# Patient Record
Sex: Male | Born: 1965 | Hispanic: No | Marital: Single | Smoking: Current every day smoker
Health system: Southern US, Community
[De-identification: ages and names within clinical notes are randomized; demographics above are authoritative.]

---

## 2013-08-05 ENCOUNTER — Ambulatory Visit: Payer: Self-pay | Admitting: Physician Assistant

## 2013-08-05 VITALS — BP 134/82 | HR 72 | Temp 97.9°F | Resp 16 | Ht 67.0 in | Wt 188.0 lb

## 2013-08-05 DIAGNOSIS — Z0289 Encounter for other administrative examinations: Secondary | ICD-10-CM

## 2013-08-05 NOTE — Progress Notes (Signed)
U/A  Sp. Gr.1.030 Protein- Neg Blood- Trace Glucose- Neg

## 2013-08-05 NOTE — Progress Notes (Signed)
Patient ID: Henry Villanueva MRN: [id number], DOB: [birth year] 47 y.o. Date of Encounter: 08/05/2013, 11:12 AM  Primary Physician: No primary provider on file.  Chief Complaint: DOT Physical   HPI: 47 y.o. male with history noted below here for DOT physical. Doing well. No issues/complaints. Generally healthy. No major health issues. No surgeries. No CP, chest tightness, SOB, wheezing, or palpitations. Working on quitting smoking. Usually gets a 2 year card.    Review of Systems: Consitutional: No fever, chills, fatigue, night sweats, lymphadenopathy, or weight changes. Eyes: No visual changes, eye redness, or discharge. ENT/Mouth: Ears: No otalgia, tinnitus, hearing loss, discharge. Nose: No congestion, rhinorrhea, sinus pain, or epistaxis. Throat: No sore throat, post nasal drip, or teeth pain. Cardiovascular: No CP, palpitations, diaphoresis, DOE, edema, orthopnea, PND. Respiratory: No cough, hemoptysis, SOB, or wheezing. Gastrointestinal: No anorexia, dysphagia, reflux, pain, nausea, vomiting, hematemesis, diarrhea, constipation, BRBPR, or melena. Genitourinary: No dysuria, frequency, urgency, hematuria, incontinence, nocturia, decreased urinary stream, discharge, impotence, or testicular pain/masses. Musculoskeletal: No decreased ROM, myalgias, stiffness, joint swelling, or weakness. Skin: No rash, erythema, lesion changes, pain, warmth, jaundice, or pruritis. Neurological: No headache, dizziness, syncope, seizures, tremors, memory loss, coordination problems, or paresthesias. Psychological: No anxiety, depression, hallucinations, SI/HI. Endocrine: No fatigue, polydipsia, polyphagia, polyuria, or known diabetes.   History reviewed. No pertinent past medical history.   History reviewed. No pertinent past surgical history.  Home Meds:  Prior to Admission medications   Not on File    Allergies: Not on File  History   Social History  . Marital Status: Single    Spouse Name: N/A   Number of Children: N/A  . Years of Education: N/A   Occupational History  . Not on file.   Social History Main Topics  . Smoking status: Heavy Tobacco Smoker -- 0.50 packs/day    Types: Cigarettes  . Smokeless tobacco: Not on file  . Alcohol Use: No  . Drug Use: No  . Sexual Activity: Not on file   Other Topics Concern  . Not on file   Social History Narrative  . No narrative on file    Family History  Problem Relation Age of Onset  . Cancer Father     bladder    Physical Exam: Blood pressure 142/84, pulse 72, temperature 97.9 F (36.6 C), temperature source Oral, resp. rate 16, height 5\' 7"  (1.702 m), weight 188 lb (85.276 kg), SpO2 98.00%. BP recheck 134/82.  General: Well developed, well nourished, in no acute distress. HEENT: Normocephalic, atraumatic. Conjunctiva pink, sclera non-icteric. Pupils 2 mm constricting to 1 mm, round, regular, and equally reactive to light and accomodation. EOMI. Internal auditory canal clear. TMs with good cone of light and without pathology. Nasal mucosa pink. Nares are without discharge. No sinus tenderness. Oral mucosa pink. Dentition normal. Pharynx without exudate.   Neck: Supple. Trachea midline. No thyromegaly. Full ROM. No lymphadenopathy. Lungs: Clear to auscultation bilaterally without wheezes, rales, or rhonchi. Breathing is of normal effort and unlabored. Cardiovascular: RRR with S1 S2. No murmurs, rubs, or gallops appreciated. Distal pulses 2+ symmetrically. No carotid or abdominal bruits. Abdomen: Soft, non-tender, non-distended with normoactive bowel sounds. No hepatosplenomegaly or masses. No rebound/guarding. No CVA tenderness. Without hernias.  Genitourinary: Circumcised male. No penile lesions. Testes descended bilaterally, and smooth without tenderness or masses.  Musculoskeletal: Full range of motion and 5/5 strength throughout. Without swelling, atrophy, tenderness, crepitus, or warmth. Extremities without clubbing,  cyanosis, or edema. Calves supple. Skin: Warm and moist without erythema, ecchymosis,  wounds, or rash. Neuro: A+Ox3. CN II-XII grossly intact. Moves all extremities spontaneously. Full sensation throughout. Normal gait. DTR 2+ throughout upper and lower extremities. Finger to nose intact. Psych:  Responds to questions appropriately with a normal affect.    Assessment/Plan:  47 y.o. male here for DOT physical. -Cleared -2 year card issued -Form completed -RTC prn  Signed, Eula Listen, PA-C 08/05/2013 11:12 AM

## 2015-05-06 ENCOUNTER — Other Ambulatory Visit: Payer: Self-pay | Admitting: Specialist

## 2015-05-06 DIAGNOSIS — M5030 Other cervical disc degeneration, unspecified cervical region: Secondary | ICD-10-CM

## 2015-05-13 ENCOUNTER — Other Ambulatory Visit: Payer: Self-pay

## 2015-05-13 ENCOUNTER — Other Ambulatory Visit: Payer: Self-pay | Admitting: Specialist

## 2015-05-13 ENCOUNTER — Ambulatory Visit
Admission: RE | Admit: 2015-05-13 | Discharge: 2015-05-13 | Disposition: A | Discharge status: Home / Self Care | Payer: 59 | Source: Ambulatory Visit | Attending: Specialist | Admitting: Specialist

## 2015-05-13 DIAGNOSIS — M5030 Other cervical disc degeneration, unspecified cervical region: Secondary | ICD-10-CM

## 2015-07-21 ENCOUNTER — Ambulatory Visit: Payer: 59 | Admitting: Physical Therapy

## 2015-08-02 ENCOUNTER — Ambulatory Visit: Payer: 59 | Attending: Neurological Surgery | Admitting: Physical Therapy

## 2015-08-02 ENCOUNTER — Encounter: Payer: Self-pay | Admitting: Physical Therapy

## 2015-08-02 DIAGNOSIS — M79602 Pain in left arm: Secondary | ICD-10-CM | POA: Diagnosis present

## 2015-08-02 DIAGNOSIS — M542 Cervicalgia: Secondary | ICD-10-CM | POA: Diagnosis present

## 2015-08-02 NOTE — Patient Instructions (Signed)
AROM: Lateral Neck Flexion   Slowly tilt head toward one shoulder, then the other. Hold each position _3__ seconds. Repeat __10__ times per set. Do _2___ sets per session. Do _2___ sessions per day.  AROM: Neck Flexion   Bend head forward. Hold _3___ seconds. Repeat _10___ times per set. Do _2___ sets per session. Do _2___ sessions per day.  Levator Stretch   Grasp seat or sit on hand on side to be stretched. Turn head toward other side and look down. Use hand on head to gently stretch neck in that position. Hold _20___ seconds. Repeat on other side. Repeat __3__ times. Do __2__ sessions per day.  Elevation / Depression   While slowly inhaling, bring shoulders toward ears. Hold _2__ seconds. Slowly exhale while relaxing shoulders backward and downward. Repeat _10__ times. Do _2__ times per day.SCAPULA: Retraction   Hold cane with both hands. Pinch shoulder blades together. Do not shrug shoulders. Hold ___ seconds. Use___ lb weight on cane. ___ reps per set, ___ sets per day, ___ days per week  NECK TENSION: Assisted Stretch   Reach right arm around head and hold slightly above ear. Gently bring right ear toward right shoulder. Hold position for ___ breaths. Repeat with other arm. Repeat ___ times, alternating arms. Do ___ times per day.  Flexibility: Neck Retraction   Pull head straight back, keeping eyes and jaw level. Repeat ____ times per set. Do ____ sets per session. Do ____ sessions per day.  

## 2015-08-03 ENCOUNTER — Ambulatory Visit (INDEPENDENT_AMBULATORY_CARE_PROVIDER_SITE_OTHER): Payer: Self-pay | Admitting: Emergency Medicine

## 2015-08-03 VITALS — BP 122/80 | HR 71 | Temp 98.4°F | Resp 16 | Ht 68.0 in | Wt 205.0 lb

## 2015-08-03 DIAGNOSIS — Z021 Encounter for pre-employment examination: Secondary | ICD-10-CM

## 2015-08-03 DIAGNOSIS — Z024 Encounter for examination for driving license: Secondary | ICD-10-CM

## 2015-08-03 NOTE — Progress Notes (Signed)
Subjective:  Patient ID: Henry Villanueva, male    DOB: [birth year]  Age: 49 y.o. MRN: [id number]  CC: Annual Exam   HPI Henry Villanueva presents  DOT  History Henry Villanueva has no past medical history on file.   He has no past surgical history on file.   His  family history includes Cancer in his father.  He   reports that he has been smoking Cigarettes.  He has been smoking about 0.50 packs per day. He does not have any smokeless tobacco history on file. He reports that he does not drink alcohol or use illicit drugs.  No outpatient prescriptions prior to visit.   No facility-administered medications prior to visit.    Social History   Social History  . Marital Status: Single    Spouse Name: N/A  . Number of Children: N/A  . Years of Education: N/A   Social History Main Topics  . Smoking status: Current Every Day Smoker -- 0.50 packs/day    Types: Cigarettes  . Smokeless tobacco: None  . Alcohol Use: No  . Drug Use: No  . Sexual Activity: Not Asked   Other Topics Concern  . None   Social History Narrative     Review of Systems  Constitutional: Negative for fever, chills and appetite change.  HENT: Negative for congestion, ear pain, postnasal drip, sinus pressure and sore throat.   Eyes: Negative for pain and redness.  Respiratory: Negative for cough, shortness of breath and wheezing.   Cardiovascular: Negative for leg swelling.  Gastrointestinal: Negative for nausea, vomiting, abdominal pain, diarrhea, constipation and blood in stool.  Endocrine: Negative for polyuria.  Genitourinary: Negative for dysuria, urgency, frequency and flank pain.  Musculoskeletal: Negative for gait problem.  Skin: Negative for rash.  Neurological: Negative for weakness and headaches.  Psychiatric/Behavioral: Negative for confusion and decreased concentration. The patient is not nervous/anxious.     Objective:  BP 122/80 mmHg  Pulse 71  Temp(Src) 98.4 F (36.9 C) (Oral)  Resp 16  Ht   (1.727 m)  Wt 205 lb (92.987 kg)  BMI 31.18 kg/m2  SpO2 98%  Physical Exam  Constitutional: He is oriented to person, place, and time. He appears well-developed and well-nourished. No distress.  HENT:  Head: Normocephalic and atraumatic.  Right Ear: External ear normal.  Left Ear: External ear normal.  Nose: Nose normal.  Eyes: Conjunctivae and EOM are normal. Pupils are equal, round, and reactive to light. No scleral icterus.  Neck: Normal range of motion. Neck supple. No tracheal deviation present.  Cardiovascular: Normal rate, regular rhythm and normal heart sounds.   Pulmonary/Chest: Effort normal. No respiratory distress. He has no wheezes. He has no rales.  Abdominal: He exhibits no mass. There is no tenderness. There is no rebound and no guarding.  Musculoskeletal: He exhibits no edema.  Lymphadenopathy:    He has no cervical adenopathy.  Neurological: He is alert and oriented to person, place, and time. Coordination normal.  Skin: Skin is warm and dry. No rash noted.  Psychiatric: He has a normal mood and affect. His behavior is normal.      Assessment & Plan:   There are no diagnoses linked to this encounter. I am having Henry Villanueva maintain his lovastatin.  Meds ordered this encounter  Medications  . lovastatin (MEVACOR) 20 MG tablet    Sig: Take 20 mg by mouth at bedtime.    Appropriate red flag conditions were discussed with the patient as well as  actions that should be taken.  Patient expressed his understanding.  Follow-up: No Follow-up on file.  Carmelina Dane, MD

## 2015-08-04 NOTE — Therapy (Signed)
[facility] [place] [address] [place], [place], [address] Phone: [phone]   Fax:  [phone]  Physical Therapy Evaluation  Patient Details  Name: Henry Villanueva MRN: [id number] Date of Birth: 07/30/1966 Referring Ozell Juhasz:  Truitt Merle, *  Encounter Date: 08/02/2015    History reviewed. No pertinent past medical history.  History reviewed. No pertinent past surgical history.  There were no vitals filed for this visit.  Visit Diagnosis:  Neck pain - Plan: PT plan of care cert/re-cert  Left arm pain - Plan: PT plan of care cert/re-cert                               PT Short Term Goals - 08/02/15 1045    PT SHORT TERM GOAL #1   Title independent with intial HEP   Time 2   Period Weeks   Status New           PT Long Term Goals - 08/02/15 1045    PT LONG TERM GOAL #1   Title Cervical ROM WNL's   Time 8   Period Weeks   Status New   PT LONG TERM GOAL #2   Title decrease pain 25%   Time 8   Period Weeks   Status New   PT LONG TERM GOAL #3   Title decrease left UE symptoms 25%   Time 8   Period Weeks   Status New   PT LONG TERM GOAL #4   Title understand posture and body mechanics   Time 8   Period Weeks   Status New                Problem List There are no active problems to display for this patient.   Jearld Lesch., PT 08/04/2015, 11:34 AM  [facility]- [address] [place] [address] [place], [place], [address] Phone: [phone]   Fax:  [phone]

## 2015-08-16 ENCOUNTER — Ambulatory Visit: Payer: 59 | Attending: Neurological Surgery | Admitting: Physical Therapy

## 2017-01-04 IMAGING — MR MR CERVICAL SPINE W/O CM
4 of 5 series · 19 of 48 positions shown · non-contrast
Comparison: None available.

CLINICAL DATA: Left shoulder pain and numbness. Tingling in the
left arm to fall fingers of the left hand.

EXAM:
MRI CERVICAL SPINE WITHOUT CONTRAST
TECHNIQUE: Multiplanar, multisequence MR imaging of the cervical spine was
performed. No intravenous contrast was administered.

[Series 2: T2 · sagittal · 3.0mm · 0.39mm/px · 6 of 12 slices shown (1 of 2)]
[im 1/12]
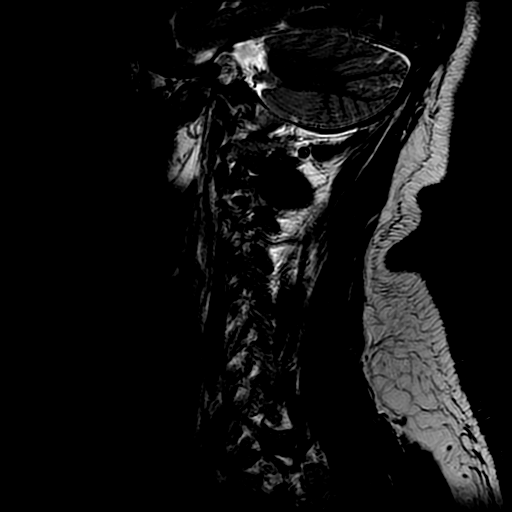
[im 3/12]
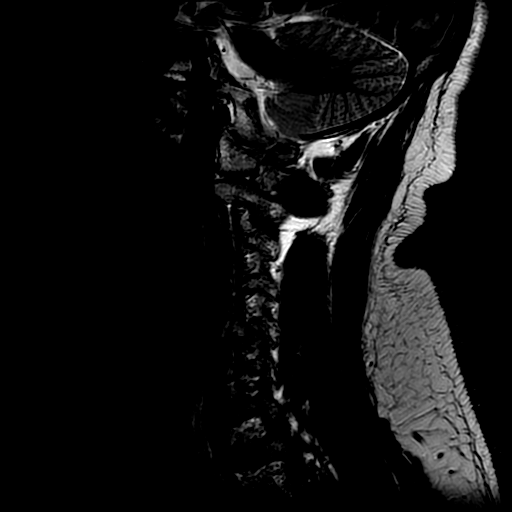
[im 5/12]
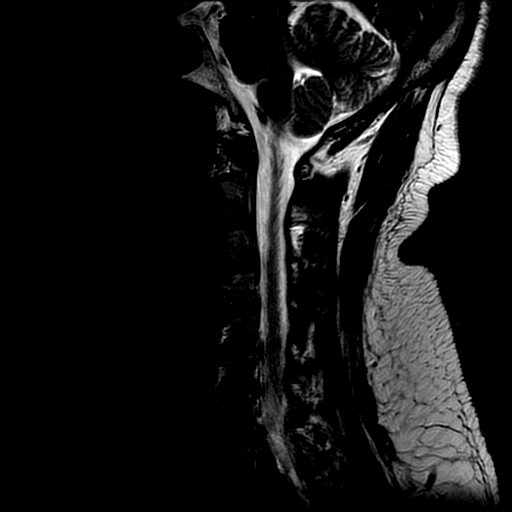
[im 7/12]
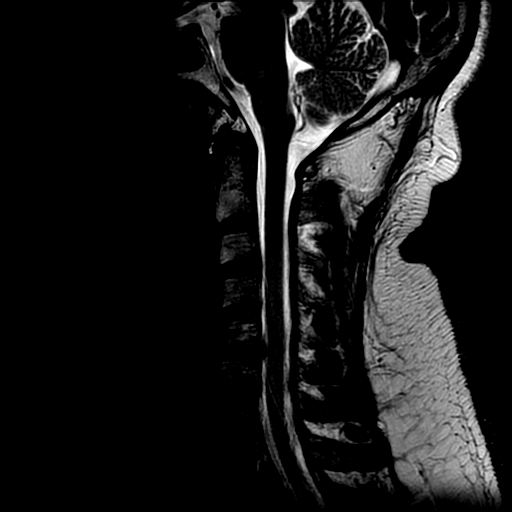
[im 9/12]
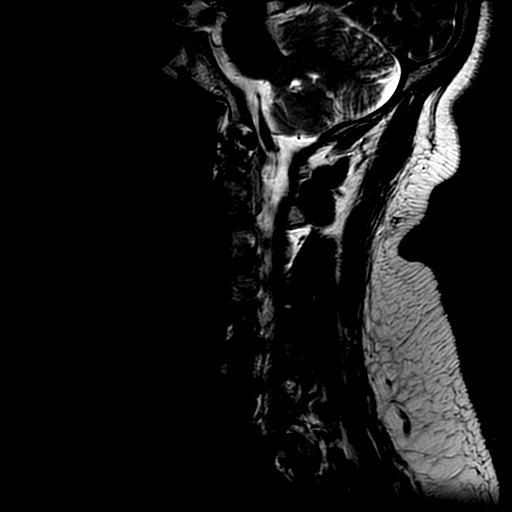
[im 12/12]
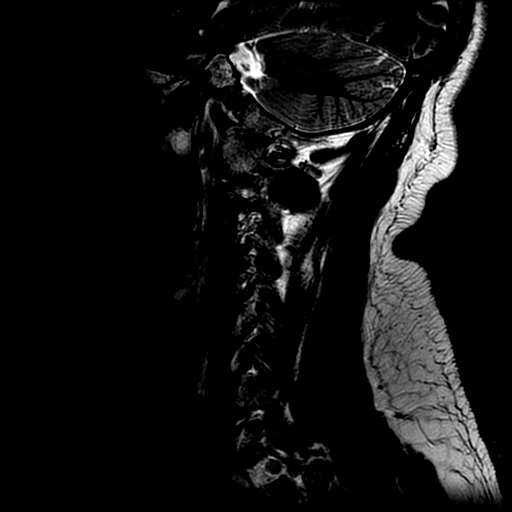

[Series 3: T1 · sagittal · 3.0mm · 0.39mm/px · 3 of 12 slices shown]
[im 2/12]
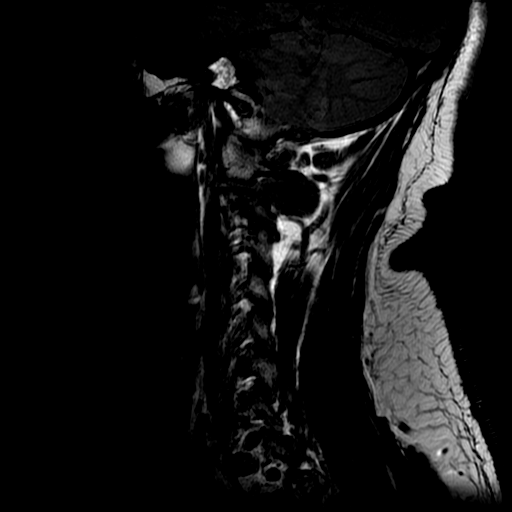
[im 6/12]
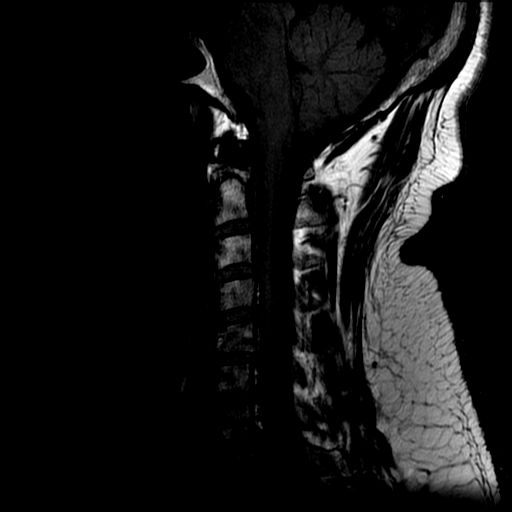
[im 10/12]
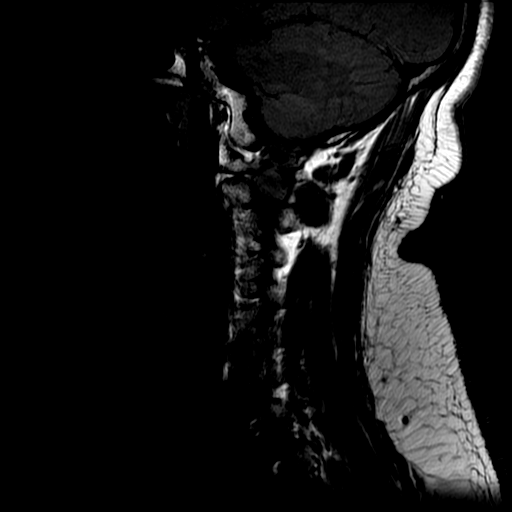

[Series 4: STIR · sagittal · 3.0mm · 0.39mm/px · 3 of 12 slices shown]
[im 2/12]
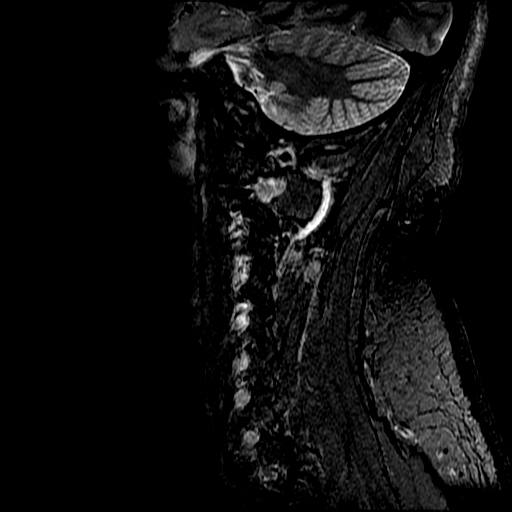
[im 6/12]
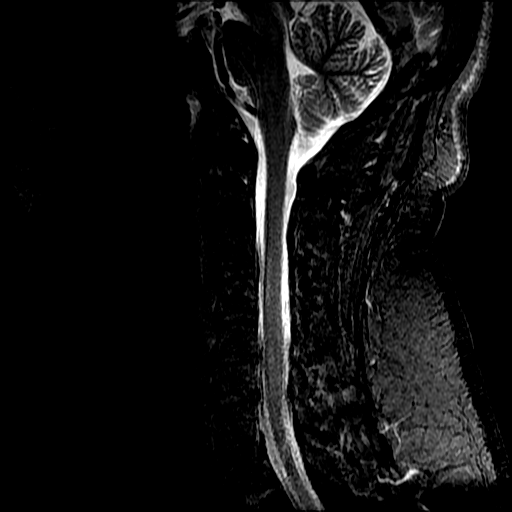
[im 10/12]
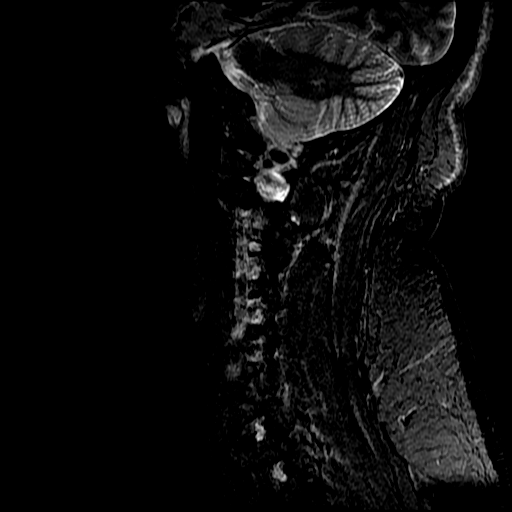

[Series 5: T2 · axial · 3.0mm · 0.39mm/px · z∈[-35,+48]mm · 7 of 26 slices shown (2 of 2)]
[im 1/26]
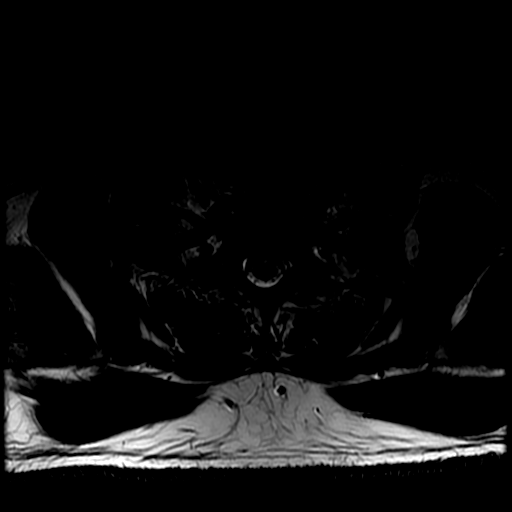
[im 4/26]
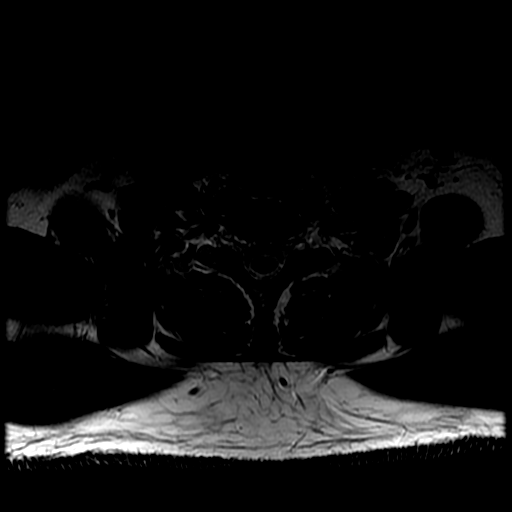
[im 8/26]
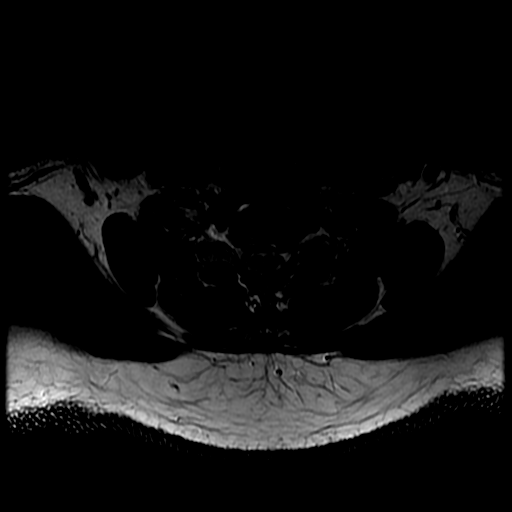
[im 12/26]
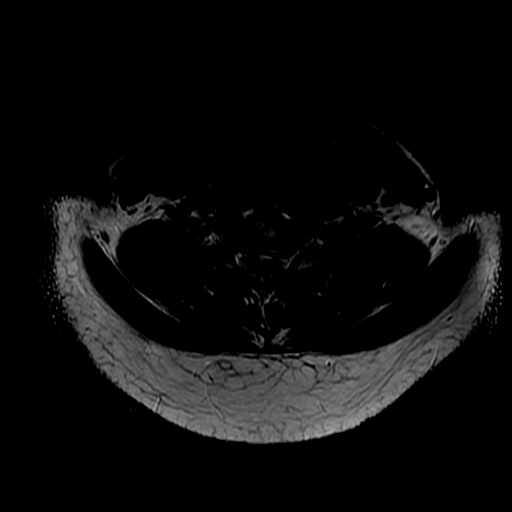
[im 14/26]
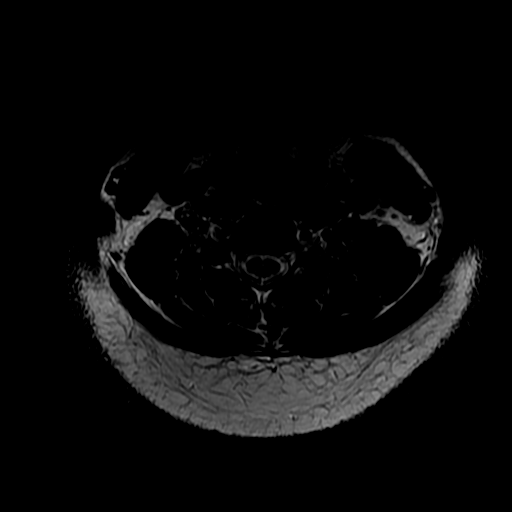
[im 18/26]
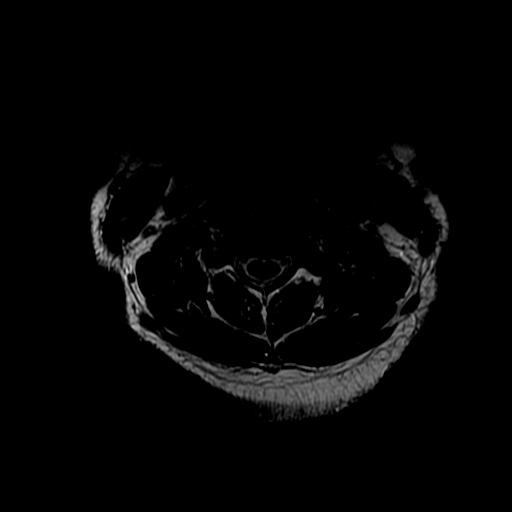
[im 22/26]
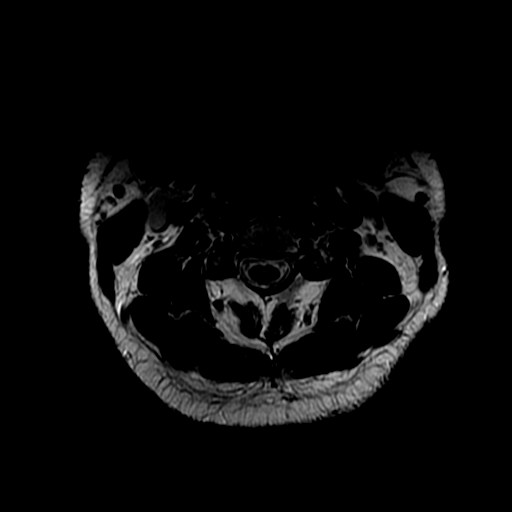

[19 of 48 positions shown; findings below may reference images not displayed]

FINDINGS: Normal signal is present in the cervical and upper thoracic spinal
cord to the lowest imaged level, T1-2. Marrow signal, vertebral body
heights, alignment are normal. The craniocervical junction is within
normal limits. Flow is present in the major intracranial arteries.

C2-3: Negative.

C3-4:  Negative.

C4-5:  Negative.

C5-6: A leftward disc osteophyte complex effaces the ventral CSF on
the left. Mild to moderate foraminal stenosis is noted bilaterally.

C6-7: A broad-based disc osteophyte complex is present. Asymmetric
left-sided uncovertebral spurring results in moderate left foraminal
stenosis. Mild right foraminal narrowing is evident. There is some
effacement of the ventral CSF.

C7-T1:  Negative.
IMPRESSION: 1. Not leftward disc osteophyte complex with moderate left central
canal stenosis at C5-6.
2. Mild to moderate foraminal narrowing bilaterally at C5-6.
3. Broad-based disc osteophyte complex with asymmetric left-sided
uncovertebral spurring results in moderate left foraminal stenosis
at C6-7.
4. Mild central and right foraminal stenosis is present at C6-7.
